# Patient Record
Sex: Female | Born: 1950 | Race: White | Hispanic: No | Marital: Married | Smoking: Never smoker
Health system: Southern US, Community
[De-identification: ages and names within clinical notes are randomized; demographics above are authoritative.]

## PROBLEM LIST (undated history)

## (undated) DIAGNOSIS — I7090 Unspecified atherosclerosis: Secondary | ICD-10-CM

## (undated) DIAGNOSIS — K5792 Diverticulitis of intestine, part unspecified, without perforation or abscess without bleeding: Secondary | ICD-10-CM

## (undated) DIAGNOSIS — C50919 Malignant neoplasm of unspecified site of unspecified female breast: Secondary | ICD-10-CM

## (undated) DIAGNOSIS — K589 Irritable bowel syndrome without diarrhea: Secondary | ICD-10-CM

## (undated) DIAGNOSIS — K219 Gastro-esophageal reflux disease without esophagitis: Secondary | ICD-10-CM

## (undated) DIAGNOSIS — E785 Hyperlipidemia, unspecified: Secondary | ICD-10-CM

## (undated) DIAGNOSIS — D869 Sarcoidosis, unspecified: Secondary | ICD-10-CM

## (undated) DIAGNOSIS — I10 Essential (primary) hypertension: Secondary | ICD-10-CM

## (undated) DIAGNOSIS — K449 Diaphragmatic hernia without obstruction or gangrene: Secondary | ICD-10-CM

## (undated) DIAGNOSIS — M1990 Unspecified osteoarthritis, unspecified site: Secondary | ICD-10-CM

## (undated) HISTORY — DX: Essential (primary) hypertension: I10

## (undated) HISTORY — DX: Malignant neoplasm of unspecified site of unspecified female breast: C50.919

## (undated) HISTORY — DX: Diaphragmatic hernia without obstruction or gangrene: K44.9

## (undated) HISTORY — DX: Unspecified atherosclerosis: I70.90

## (undated) HISTORY — DX: Unspecified osteoarthritis, unspecified site: M19.90

## (undated) HISTORY — DX: Hyperlipidemia, unspecified: E78.5

## (undated) HISTORY — DX: Irritable bowel syndrome, unspecified: K58.9

## (undated) HISTORY — DX: Gastro-esophageal reflux disease without esophagitis: K21.9

## (undated) HISTORY — PX: FOOT SURGERY: SHX648

## (undated) HISTORY — DX: Diverticulitis of intestine, part unspecified, without perforation or abscess without bleeding: K57.92

## (undated) HISTORY — DX: Sarcoidosis, unspecified: D86.9

---

## 1991-12-27 HISTORY — PX: ABDOMINAL HYSTERECTOMY: SHX81

## 2003-12-27 DIAGNOSIS — C50919 Malignant neoplasm of unspecified site of unspecified female breast: Secondary | ICD-10-CM

## 2003-12-27 HISTORY — DX: Malignant neoplasm of unspecified site of unspecified female breast: C50.919

## 2004-12-26 HISTORY — PX: MASTECTOMY WITH AXILLARY LYMPH NODE DISSECTION: SHX5661

## 2004-12-26 HISTORY — PX: PLACEMENT OF BREAST IMPLANTS: SHX6334

## 2005-06-07 ENCOUNTER — Encounter: Payer: Self-pay | Admitting: Podiatry

## 2005-06-25 ENCOUNTER — Encounter: Payer: Self-pay | Admitting: Podiatry

## 2010-10-11 ENCOUNTER — Ambulatory Visit: Payer: Self-pay | Admitting: Family Medicine

## 2010-11-11 ENCOUNTER — Ambulatory Visit: Payer: Self-pay | Admitting: Family Medicine

## 2011-06-07 ENCOUNTER — Ambulatory Visit: Payer: Self-pay | Admitting: Family Medicine

## 2011-11-21 ENCOUNTER — Ambulatory Visit: Payer: Self-pay | Admitting: Family Medicine

## 2011-12-08 ENCOUNTER — Ambulatory Visit: Payer: Self-pay | Admitting: Family Medicine

## 2012-04-30 ENCOUNTER — Ambulatory Visit: Payer: Self-pay | Admitting: Family Medicine

## 2012-04-30 LAB — CREATININE, SERUM
EGFR (African American): >60
EGFR (Non-African Amer.): >60

## 2012-05-14 IMAGING — CT CT CHEST W/ CM
1 series · 15 of 32 positions shown, 19 images · IV contrast (isovue)
Comparison: none

REASON FOR EXAM: SOB
COMMENTS:

PROCEDURE:     HAIR - HAIR CHEST WITH CONTRAST  - October 11, 2010 [DATE]
RESULT:     Comparison: None
TECHNIQUE: Multiple axial images of the chest were obtained with 75 mL
Isovue 370 intravenous contrast.

[Series 2: soft tissue · axial · 0.59mm/px · z∈[-77,+168]mm · 15 of 57 slices shown, 19 images]
[im 5/57  mediastinal]
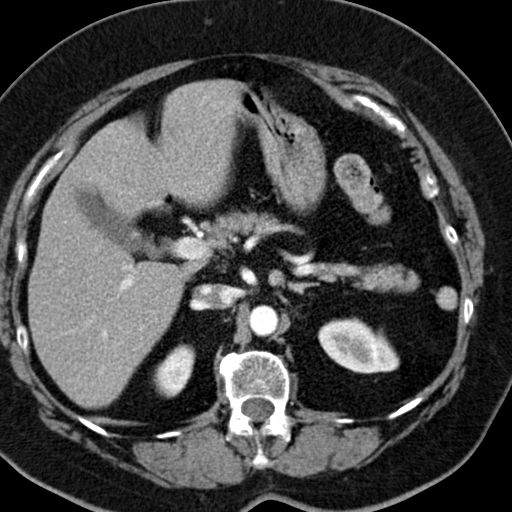
[im 5/57  lung]
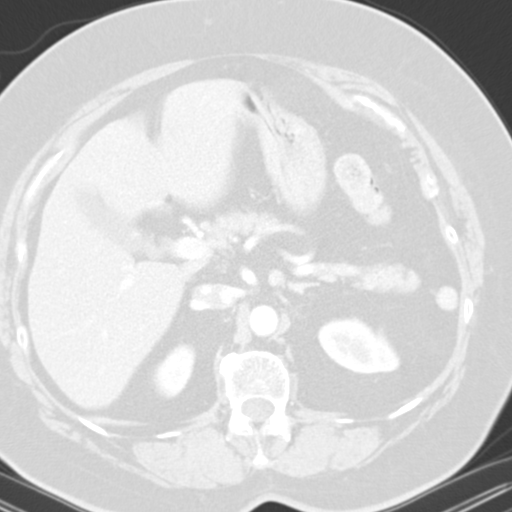
[im 9/57  lung]
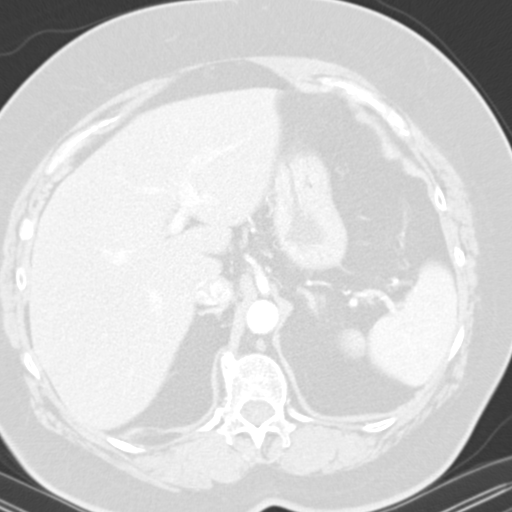
[im 12/57  lung]
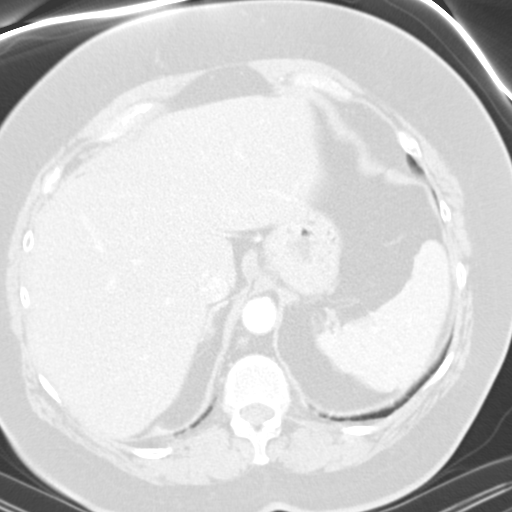
[im 15/57  lung]
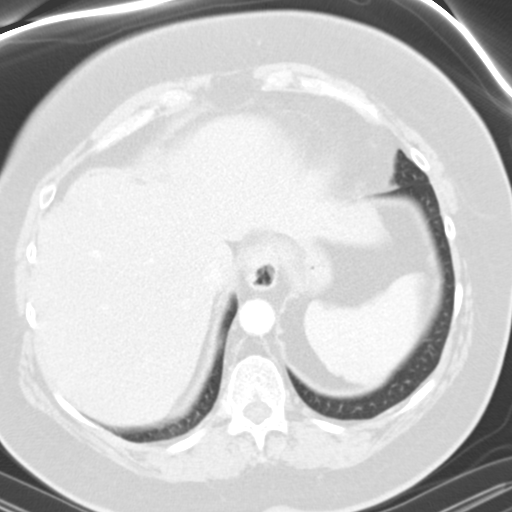
[im 19/57  mediastinal]
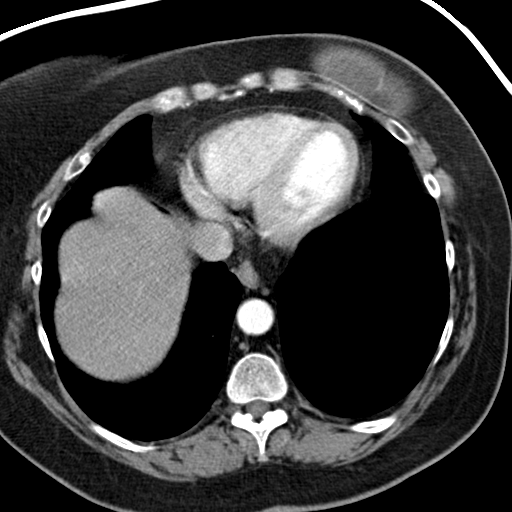
[im 19/57  lung]
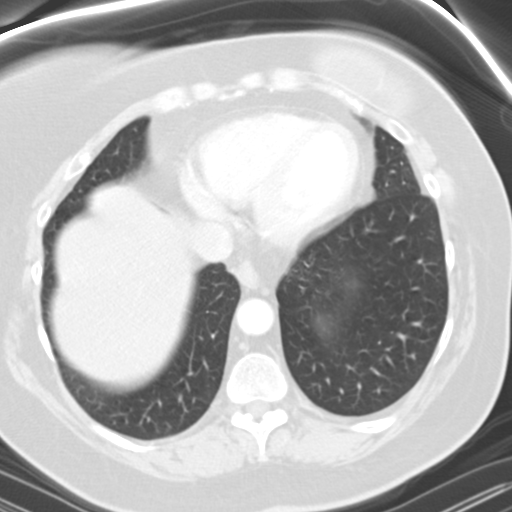
[im 23/57  lung]
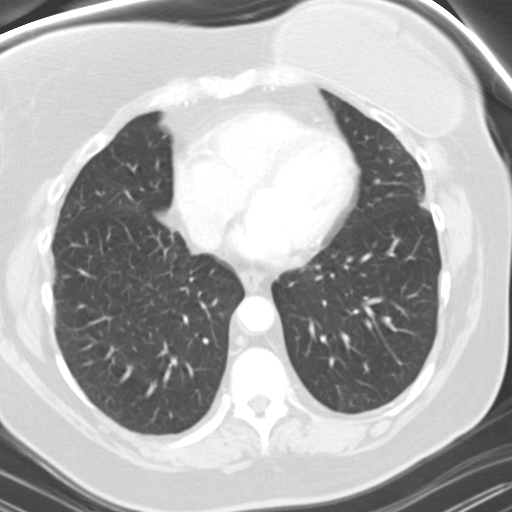
[im 27/57  lung]
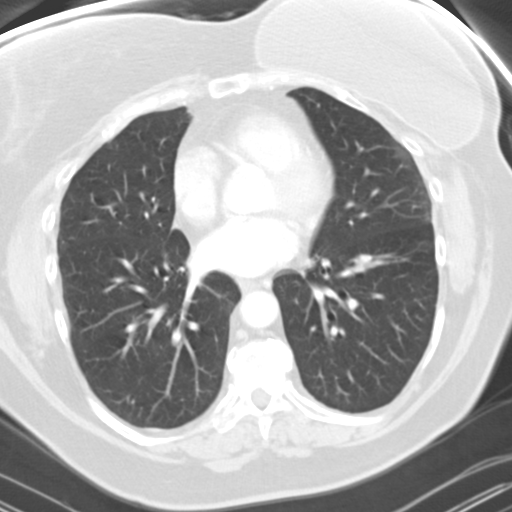
[im 30/57  lung]
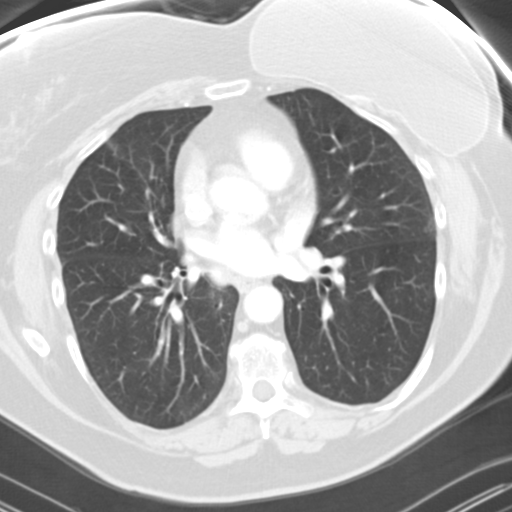
[im 32/57  mediastinal]
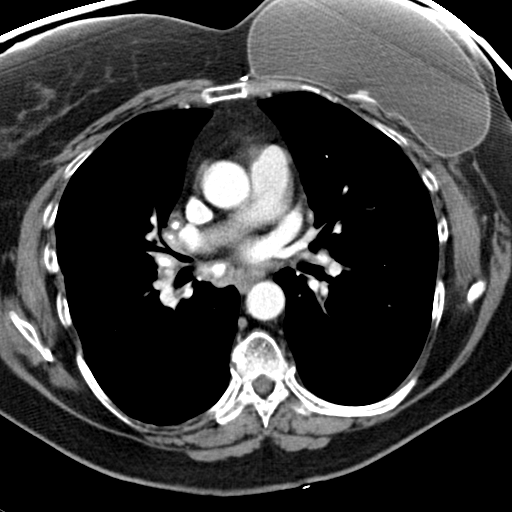
[im 32/57  lung]
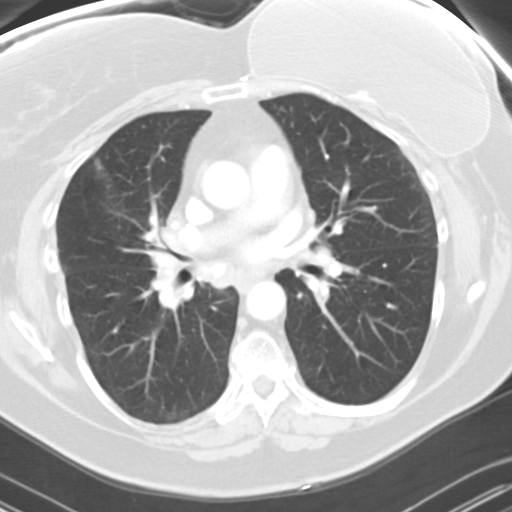
[im 36/57  lung]
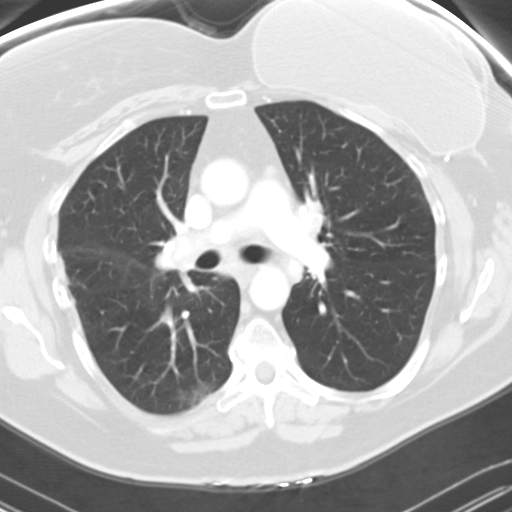
[im 40/57  lung]
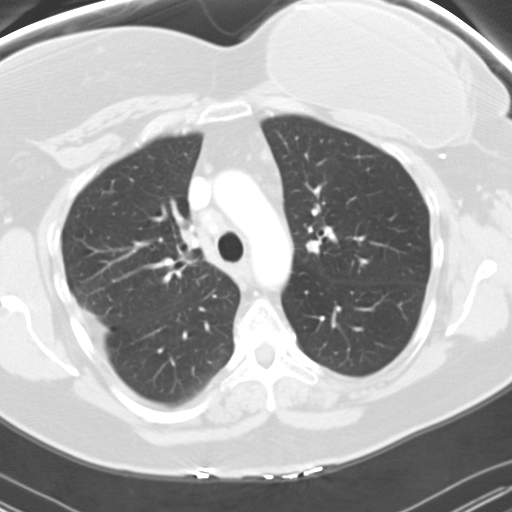
[im 44/57  lung]
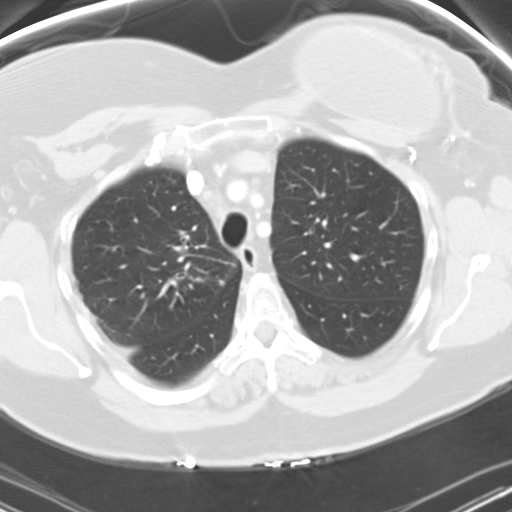
[im 46/57  mediastinal]
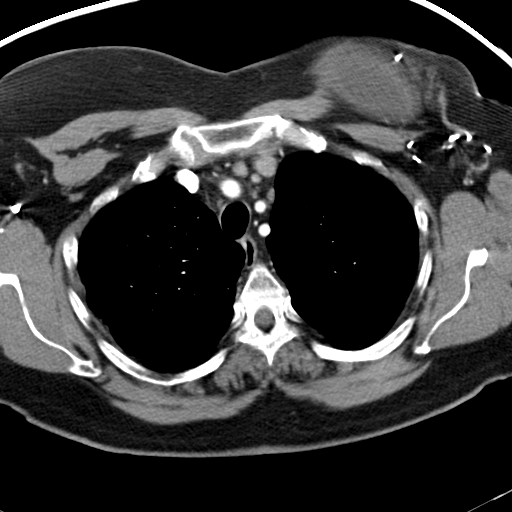
[im 46/57  lung]
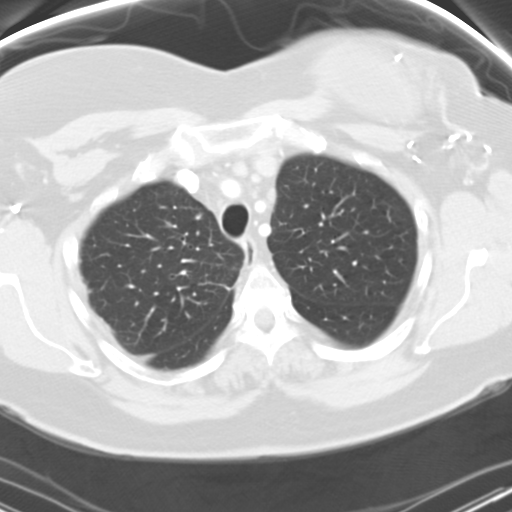
[im 50/57  lung]
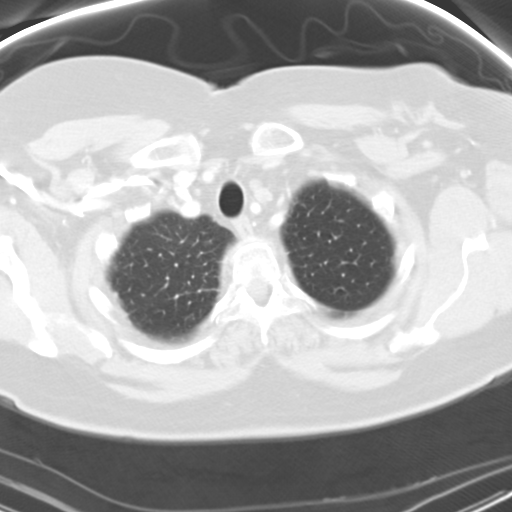
[im 54/57  lung]
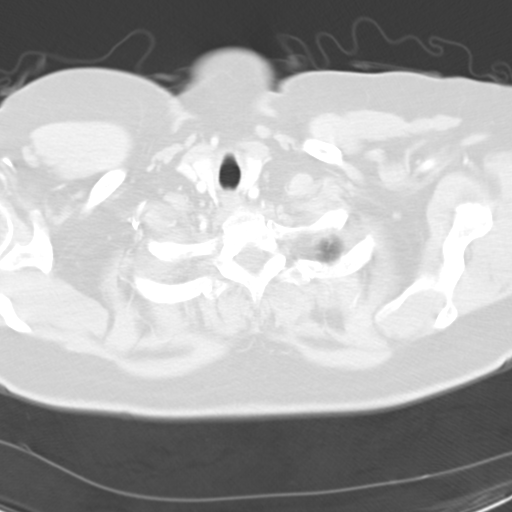

[15 of 32 positions shown; findings below may reference images not displayed]

FINDINGS: There are multiple mildly enlarged mediastinal lymph nodes which have
central calcification. There is similar calcified bilateral hilar lymph
nodes. No axillary lymphadenopathy. There is a left breast prosthetic
implant. Prior left axillary lymph node dissection. Small hiatal hernia.

A 4 mm nodule seen in the right upper lobe, image 13. There is a 4 mm
groundglass nodule in the right upper lobe, image 20. 3 mm groundglass
nodule demonstrated in the right middle lobe, image 25. A 7 mm nodule is
seen in the left upper lobe, image 17. It may be partially calcified, versus
an adjacent contrast opacified blood vessel. Minimal subpleural groundglass
opacities in the right lower lobe and lingula are favored to represent
atelectasis.

No aggressive lytic or sclerotic osseous lesions identified.
IMPRESSION: 1. Calcified mediastinal and hilar lymph nodes which would be in keeping
with patient's reported history of sarcoidosis.
2. Minimal subpleural groundglass opacities likely represent atelectasis.
Alternatively, very early/minimal changes of interstitial lung disease could
have a similar appearance.
3. Multiple subcentimeter indeterminate pulmonary nodules. If the patient is
at low risk for lung cancer, recommend follow-up CT in 6 to 12 months.  If
the patient is at high risk for lung cancer, recommend follow-up CT in 3 to
6 months.  This CT can be performed without contrast.

## 2012-08-09 ENCOUNTER — Ambulatory Visit: Payer: Self-pay | Admitting: Family Medicine

## 2012-08-09 LAB — CREATININE, SERUM
EGFR (African American): >60
EGFR (Non-African Amer.): >60

## 2012-12-15 ENCOUNTER — Ambulatory Visit: Payer: Self-pay

## 2013-04-21 ENCOUNTER — Ambulatory Visit: Payer: Self-pay | Admitting: Emergency Medicine

## 2013-04-21 LAB — URINALYSIS, COMPLETE
Glucose,UR: NEGATIVE mg/dL (ref 0–75)
Ketone: NEGATIVE
Nitrite: POSITIVE
Ph: 6 (ref 4.5–8.0)

## 2013-04-23 LAB — URINE CULTURE

## 2013-06-04 ENCOUNTER — Ambulatory Visit: Payer: Self-pay | Admitting: Family Medicine

## 2013-06-11 ENCOUNTER — Ambulatory Visit: Payer: Self-pay

## 2013-08-25 ENCOUNTER — Ambulatory Visit: Payer: Self-pay | Admitting: Family Medicine

## 2013-08-25 LAB — URINALYSIS, COMPLETE
Bilirubin,UR: NEGATIVE
Glucose,UR: NEGATIVE mg/dL (ref 0–75)
Ketone: NEGATIVE
Nitrite: POSITIVE
Ph: 6.5 (ref 4.5–8.0)
Specific Gravity: 1.01 (ref 1.003–1.030)

## 2013-08-29 LAB — URINE CULTURE

## 2013-10-15 ENCOUNTER — Ambulatory Visit: Payer: Self-pay | Admitting: Family Medicine

## 2013-10-15 LAB — URINALYSIS, COMPLETE
Ketone: NEGATIVE
Leukocyte Esterase: NEGATIVE

## 2014-01-26 ENCOUNTER — Ambulatory Visit: Payer: Self-pay | Admitting: Physician Assistant

## 2014-01-26 LAB — URINALYSIS, COMPLETE
Bilirubin,UR: NEGATIVE
Glucose,UR: NEGATIVE mg/dL (ref 0–75)
KETONE: NEGATIVE
Nitrite: POSITIVE
Ph: 6 (ref 4.5–8.0)
Protein: NEGATIVE
SPECIFIC GRAVITY: 1.01 (ref 1.003–1.030)

## 2014-01-28 LAB — URINE CULTURE

## 2015-01-11 ENCOUNTER — Ambulatory Visit: Payer: Self-pay

## 2015-01-11 LAB — URINALYSIS, COMPLETE
BILIRUBIN, UR: NEGATIVE
GLUCOSE, UR: NEGATIVE
Ketone: NEGATIVE
Nitrite: NEGATIVE
Ph: 5.5 (ref 5.0–8.0)
Protein: NEGATIVE
SPECIFIC GRAVITY: 1.01 (ref 1.000–1.030)

## 2015-01-16 LAB — URINE CULTURE

## 2015-01-21 ENCOUNTER — Ambulatory Visit: Payer: Self-pay | Admitting: Internal Medicine

## 2015-01-21 LAB — URINALYSIS, COMPLETE
Bilirubin,UR: NEGATIVE
Glucose,UR: NEGATIVE
KETONE: NEGATIVE
NITRITE: NEGATIVE
PH: 5.5 (ref 5.0–8.0)
SPECIFIC GRAVITY: 1.005 (ref 1.000–1.030)

## 2015-01-24 LAB — URINE CULTURE

## 2015-05-07 ENCOUNTER — Other Ambulatory Visit: Payer: Self-pay | Admitting: Family Medicine

## 2015-05-07 DIAGNOSIS — Z1239 Encounter for other screening for malignant neoplasm of breast: Secondary | ICD-10-CM

## 2017-08-18 ENCOUNTER — Telehealth: Payer: Self-pay

## 2017-09-01 NOTE — Telephone Encounter (Signed)
Patient states she has IBS controlled with Imodium and diet.   Patient unable to schedule at this time. Requested appointment letter be mailed. She will callback to reschedule when it's appropriate for her.

## 2017-11-27 ENCOUNTER — Other Ambulatory Visit: Payer: Self-pay

## 2017-11-30 NOTE — Progress Notes (Signed)
12/01/2017 7:42 PM   Jo Delacruz [birth year] [id number]  Referring provider: Hortencia Pilar, [address] [place], [place] [address]  Chief Complaint  Patient presents with  . Urinary Tract Infection    HPI: Patient is a 66 year old Caucasian female who is referred to [place] by, Dr. Hoy Morn, for recurrent urinary tract infections.  Patient states that she has had a lot urinary tract infections over the last year.  Reviewing her records,  she has had numerous documented infections yearly for the last three to five years.  Her symptoms with a urinary tract infection consist of "all this pressure," has to go, go, go and has to get up at night to urinate.  She endorses burning after urination, but she denies suprapubic pain, back pain, abdominal pain or flank pain.  She has not had any recent fevers, chills, nausea or vomiting.   She has had a bladder sling with her hysterectomy in 1996.  She does not have a history of nephrolithiasis or GU trauma.   She states she also has fecal incontinence.  She has been giving her urine samples for culture from a hat as she is unable to urinate in the specimen cup.    She is not sexually active.   She is postmenopausal.   She admits to constipation and/or diarrhea.   She does not engage in good perineal hygiene due to her IBS.  She does not take tub baths.  She nocturia x 1, intermittency and burning after urination are baseline.  She does not have incontinence.  Her CATH UA today is negative .  Her PVR is 100 cc.    She is having pain with bladder filling.    She has been evaluated for recurrent UTI's in the past with Dr. Rock Nephew.  They discussed UTI prevention strategies, underwent CTU and cystoscopy, advised to have CATH UA's and started on antibiotic suppression for six months.    CTU performed in [id number]/7371 noted duplicated left renal collecting system. No stones identified. No renal masses identified. On delayed phase images, no filling defects  identified within the opacified portions of the renal collecting systems. Mild thickening noted at the bilateral UVJs within the bladder, nonspecific. Limited evaluation of the bladder. Apparent focus of air (6:74) favored to be an adjacent bowel.  Cystoscopy today notable for 2 left ureteral orifices (which was known) and one right, all with flow.  She is drinking two 16 ounce bottles of water daily.   8 ounces of sweet tea.  Two cups of coffee in the am.  No sodas.  No alcohol.  One in awhile she drinks cranberry juice.  Taken d-mannose and cranberry tablets, but stopped recently.  Was taken probiotics.     Reviewed referral notes.    PMH: Past Medical History:  Diagnosis Date  . Atherosclerosis   . Breast cancer ([place]) 2005   Left- requiring Mastectomy   . Diverticulitis   . GERD (gastroesophageal reflux disease)   . Hiatal hernia   . Hyperlipidemia   . Hypertension   . Irritable bowel syndrome   . OA (osteoarthritis)   . Sarcoidosis     Surgical History: Past Surgical History:  Procedure Laterality Date  . ABDOMINAL HYSTERECTOMY  1993   Partial  . FOOT SURGERY Left    Bone Fusion- [facility] in [place] Left 2006   [place] Left 2006   following  Mastectomy    Home Medications:  Allergies as of 12/01/2017      Reactions   Oxycodone-acetaminophen Hives   Cephalexin Rash   Cymbalta [duloxetine Hcl] Other (See Comments)   Cold Sweats      Medication List        Accurate as of 12/01/17 11:59 PM. Always use your most recent med list.          Calcium Carbonate-Vitamin D 600-400 MG-UNIT tablet Take 1 tablet by mouth 2 (two) times daily.   diclofenac 75 MG EC tablet Commonly known as:  VOLTAREN Take 1 tablet by mouth 2 (two) times daily.   losartan 25 MG tablet Commonly known as:  COZAAR Take 1 tablet by mouth daily.   mometasone 220 MCG/INH inhaler Commonly known  as:  ASMANEX Inhale 2 puffs into the lungs daily.   MULTI-VITAMINS Tabs Take 1 tablet by mouth daily.   oxybutynin 5 MG 24 hr tablet Commonly known as:  DITROPAN-XL Take 1 tablet by mouth daily.   PREMARIN vaginal cream Generic drug:  conjugated estrogens Place 1 application vaginally as directed.   PROAIR HFA 108 (90 Base) MCG/ACT inhaler Generic drug:  albuterol Inhale 2 puffs into the lungs every 6 (six) hours as needed.   rosuvastatin 20 MG tablet Commonly known as:  CRESTOR Take 1 tablet by mouth at bedtime.       Allergies:  Allergies  Allergen Reactions  . Oxycodone-Acetaminophen Hives  . Cephalexin Rash  . Cymbalta [Duloxetine Hcl] Other (See Comments)    Cold Sweats    Family History: Family History  Problem Relation Age of Onset  . Lung cancer Father   . Hematuria Mother   . Prostate cancer Maternal Grandfather   . Kidney disease Neg Hx   . Sickle cell trait Neg Hx   . Tuberculosis Neg Hx     Social History:  reports that  has never smoked. she has never used smokeless tobacco. She reports that she does not drink alcohol or use drugs.  ROS: UROLOGY Frequent Urination?: Yes Hard to postpone urination?: No Burning/pain with urination?: Yes Get up at night to urinate?: Yes Leakage of urine?: No Urine stream starts and stops?: Yes Trouble starting stream?: No Do you have to strain to urinate?: No Blood in urine?: No Urinary tract infection?: Yes Sexually transmitted disease?: No Injury to kidneys or bladder?: No Painful intercourse?: No Weak stream?: No Currently pregnant?: No Vaginal bleeding?: No Last menstrual period?: Partial Hysterectomy  Gastrointestinal Nausea?: No Vomiting?: No Indigestion/heartburn?: No Diarrhea?: No Constipation?: No  Constitutional Fever: No Night sweats?: No Weight loss?: No Fatigue?: No  Skin Skin rash/lesions?: No Itching?: No  Eyes Blurred vision?: No Double vision?:  No  Ears/Nose/Throat Sore throat?: No Sinus problems?: No  Hematologic/Lymphatic Swollen glands?: No Easy bruising?: No  Cardiovascular Leg swelling?: No Chest pain?: No  Respiratory Cough?: No Shortness of breath?: No  Endocrine Excessive thirst?: No  Musculoskeletal Back pain?: No Joint pain?: No  Neurological Headaches?: No Dizziness?: No  Psychologic Depression?: No Anxiety?: No  Physical Exam: BP (!) 150/82   Pulse 95   Ht _0  (1.575 m)   Wt 136 lb 8 oz (61.9 kg)   BMI 24.97 kg/m   Constitutional: Well nourished. Alert and oriented, No acute distress. HEENT: [place] AT, moist mucus membranes. Trachea midline, no masses. Cardiovascular: No clubbing, cyanosis, or edema. Respiratory: Normal respiratory effort, no increased work of breathing. GI: Abdomen is soft, non tender, non distended, no abdominal  masses. Liver and spleen not palpable.  No hernias appreciated.  Stool sample for occult testing is not indicated.   GU: No CVA tenderness.  No bladder fullness or masses.  Atrophic external genitalia, normal pubic hair distribution, no lesions.  Normal urethral meatus, no lesions, no prolapse, no discharge.   No urethral masses, tenderness and/or tenderness. No bladder fullness, tenderness or masses. Pale vagina mucosa, poor estrogen effect, no discharge, no lesions, good pelvic support, Grade II cystocele or rectocele noted.  Cervix and uterus are surgically absent.  No adnexal/parametria masses or tenderness noted.  Anus and perineum are without rashes or lesions.    Skin: No rashes, bruises or suspicious lesions. Lymph: No cervical or inguinal adenopathy. Neurologic: Grossly intact, no focal deficits, moving all 4 extremities. Psychiatric: Normal mood and affect.  Laboratory Data: Urinalysis CATH UA is negative.  See Epic.   I have reviewed the labs.   Pertinent Imaging: EXAM: CT abdomen with and without contrast with image post-processing, CT pelvis with  contrast with image post-processing DATE: 06/01/2016 9:48 AM ACCESSION: [id number] UN DICTATED: 06/01/2016 9:53 AM INTERPRETATION LOCATION: [facility]  CLINICAL INDICATION: 66 years old Female with OTHER-hematuriaN39.0-Recurrent UTI  COMPARISON: None available  TECHNIQUE: A spiral CT scan was obtained without contrast, then with contrast in the nephrographic phase.Delayed post-contrast images through the abdomen and pelvis were obtained in the urographic phase.Images were reconstructed in the axial plane. Coronal and sagittal reformatted images of the abdomen and pelvis were also provided for further evaluation.  FINDINGS:   HEPATOBILIARY: Unremarkable liver. No biliary ductal dilatation. Gallbladder is unremarkable. PANCREAS: Unremarkable. SPLEEN: Unremarkable. ADRENAL GLANDS: Unremarkable. KIDNEYS/URETERS: Duplicated left renal collecting system. No stones identified. No renal masses identified. On delayed phase images, no filling defects identified within the opacified portions of the renal collecting systems. Mild thickening noted at the bilateral UVJs within the bladder, nonspecific. Limited evaluation of the bladder. Apparent focus of air (6:74) favored to be an adjacent bowel.  BOWEL/PERITONEUM/RETROPERITONEUM: No bowel obstruction. Colonic diverticulosis. Nonspecific nodule adjacent to the descending colon (4:32); query sequela of prior inflammation. Small hiatal hernia VASCULATURE: Atherosclerotic disease. LYMPH NODES: Subcentimeter retroperitoneal lymph nodes. Mildly more prominent bilateral pelvic sidewall lymph nodes. A representative nodes include 8 mm right pelvic sidewall node (4:59). REPRODUCTIVE ORGANS: Status post hysterectomy. Small volume bilateral adnexal soft tissue. Pelvic floor laxity.  BONES/SOFT TISSUES: Left breast implant. No worrisome osseous lesion identified.   IMPRESSION:  -No nephroureterolithiasis or definite urothelial lesion. Mild thickening of the  bilateral UVJs within the bladder, nonspecific. Consider correlation with cystoscopy as clinically appropriate. -Duplicated left renal collecting system.    Assessment & Plan:   1. Recurrent UTI's  - criteria for recurrent UTI has been met with 2 or more infections in 6 months or 3 or greater infections in one year   - Patient is instructed to increase their water intake until the urine is pale yellow or clear (10 to 12 cups daily)   - probiotics (yogurt, oral pills or vaginal suppositories), take cranberry pills or drink the juice and Vitamin C 1,000 mg daily to acidify the urine should be added to their daily regimen   - avoid soaking in tubs and wipe front to back after urinating   - advised them to have CATH UA's for urinalysis and culture to prevent skin contamination of the specimen  - reviewed symptoms of UTI and advised not to have urine checked or be treated for UTI if not experiencing symptoms  - discussed antibiotic stewardship  with the patient    2. Vaginal atrophy  - patient is s/p mastectomy for breast cancer, not a candidate for vaginal estrogen cream   - states she has been cleared for vaginal estrogen therapy and has been using the cream twice weekly  3. Burning after urination  - CATH UA will be sent for culture  - follow up pending culture results                                          Return for pending urine culture results .  These notes generated with voice recognition software. I apologize for typographical errors.  Zara Council, [facility] [address] [place], [place] [address] [phone]

## 2017-12-01 ENCOUNTER — Ambulatory Visit (INDEPENDENT_AMBULATORY_CARE_PROVIDER_SITE_OTHER): Payer: Medicare Other | Admitting: Urology

## 2017-12-01 ENCOUNTER — Encounter: Payer: Self-pay | Admitting: Urology

## 2017-12-01 ENCOUNTER — Other Ambulatory Visit
Admission: RE | Admit: 2017-12-01 | Discharge: 2017-12-01 | Disposition: A | Discharge status: Home / Self Care | Payer: Medicare Other | Source: Ambulatory Visit | Attending: Urology | Admitting: Urology

## 2017-12-01 VITALS — BP 150/82 | HR 95 | Ht 62.0 in | Wt 136.5 lb

## 2017-12-01 DIAGNOSIS — R52 Pain, unspecified: Secondary | ICD-10-CM

## 2017-12-01 DIAGNOSIS — N952 Postmenopausal atrophic vaginitis: Secondary | ICD-10-CM | POA: Diagnosis not present

## 2017-12-01 DIAGNOSIS — N390 Urinary tract infection, site not specified: Secondary | ICD-10-CM

## 2017-12-01 LAB — URINALYSIS, COMPLETE (UACMP) WITH MICROSCOPIC
BILIRUBIN URINE: NEGATIVE
Bacteria, UA: NONE SEEN
GLUCOSE, UA: NEGATIVE mg/dL
HGB URINE DIPSTICK: NEGATIVE
KETONES UR: NEGATIVE mg/dL
LEUKOCYTES UA: NEGATIVE
NITRITE: NEGATIVE
PH: 5.5 (ref 5.0–8.0)
PROTEIN: NEGATIVE mg/dL
Specific Gravity, Urine: 1.01 (ref 1.005–1.030)

## 2017-12-03 LAB — URINE CULTURE: Culture: NO GROWTH

## 2017-12-05 ENCOUNTER — Telehealth: Payer: Self-pay

## 2017-12-05 NOTE — Telephone Encounter (Signed)
-----   Message from Nori Riis, PA-C sent at 12/04/2017  9:55 AM EST ----- Please let Mrs. Hepp know that her urine culture is negative.  I would like her to increase her oxybutynin XL 5 mg to 10 mg daily and follow up in 6 weeks.  She can take two tablets of her prescription and if she will run low we can call in a prescription for the oxybutynin XL 10 mg daily.

## 2017-12-05 NOTE — Telephone Encounter (Signed)
Spoke with pt in reference to -ucx and increasing oxybutynin to 10mg . Pt stated that she has done that in the past and had a lot of bladder pressure therefore she went back to 5mg . Encouraged pt to try increasing dosage again and if uncomfortable to call back. Pt voiced understanding.  Offered to make pt appt. Pt requested appt to be in [place]. Can you make the appt?

## 2017-12-06 NOTE — Telephone Encounter (Signed)
App made 

## 2018-01-12 ENCOUNTER — Ambulatory Visit: Payer: Medicare Other | Admitting: Urology
